# Patient Record
Sex: Male | Born: 1980 | Race: White | Hispanic: No | Marital: Single | Smoking: Current every day smoker
Health system: Southern US, Community
[De-identification: ages and names within clinical notes are randomized; demographics above are authoritative.]

## PROBLEM LIST (undated history)

## (undated) HISTORY — PX: TONSILLECTOMY: SUR1361

## (undated) HISTORY — PX: HERNIA REPAIR: SHX51

---
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
## 2001-08-03 ENCOUNTER — Emergency Department (HOSPITAL_COMMUNITY): Admission: EM | Admit: 2001-08-03 | Discharge: 2001-08-04 | Payer: Self-pay | Admitting: Emergency Medicine

## 2001-08-03 ENCOUNTER — Encounter: Payer: Self-pay | Admitting: Emergency Medicine

## 2001-08-04 ENCOUNTER — Encounter: Payer: Self-pay | Admitting: Emergency Medicine

## 2002-02-09 ENCOUNTER — Emergency Department (HOSPITAL_COMMUNITY): Admission: EM | Admit: 2002-02-09 | Discharge: 2002-02-09 | Payer: Self-pay | Admitting: *Deleted

## 2003-02-16 ENCOUNTER — Emergency Department (HOSPITAL_COMMUNITY): Admission: EM | Admit: 2003-02-16 | Discharge: 2003-02-16 | Payer: Self-pay | Admitting: Emergency Medicine

## 2003-03-19 ENCOUNTER — Ambulatory Visit (HOSPITAL_COMMUNITY): Admission: RE | Admit: 2003-03-19 | Discharge: 2003-03-19 | Payer: Self-pay | Admitting: Orthopaedic Surgery

## 2003-08-25 ENCOUNTER — Emergency Department (HOSPITAL_COMMUNITY): Admission: EM | Admit: 2003-08-25 | Discharge: 2003-08-25 | Payer: Self-pay | Admitting: Emergency Medicine

## 2003-08-31 ENCOUNTER — Observation Stay (HOSPITAL_COMMUNITY): Admission: RE | Admit: 2003-08-31 | Discharge: 2003-09-01 | Payer: Self-pay | Admitting: Orthopaedic Surgery

## 2005-01-01 ENCOUNTER — Emergency Department (HOSPITAL_COMMUNITY): Admission: EM | Admit: 2005-01-01 | Discharge: 2005-01-01 | Payer: Self-pay | Admitting: Emergency Medicine

## 2006-09-01 ENCOUNTER — Emergency Department (HOSPITAL_COMMUNITY): Admission: EM | Admit: 2006-09-01 | Discharge: 2006-09-01 | Payer: Self-pay | Admitting: Emergency Medicine

## 2007-12-24 ENCOUNTER — Emergency Department (HOSPITAL_COMMUNITY): Admission: EM | Admit: 2007-12-24 | Discharge: 2007-12-24 | Payer: Self-pay | Admitting: Emergency Medicine

## 2009-02-03 ENCOUNTER — Emergency Department (HOSPITAL_COMMUNITY): Admission: EM | Admit: 2009-02-03 | Discharge: 2009-02-03 | Payer: Self-pay | Admitting: Emergency Medicine

## 2009-07-20 ENCOUNTER — Emergency Department (HOSPITAL_COMMUNITY): Admission: EM | Admit: 2009-07-20 | Discharge: 2009-07-20 | Payer: Self-pay | Admitting: Emergency Medicine

## 2010-02-14 ENCOUNTER — Emergency Department (HOSPITAL_COMMUNITY)
Admission: EM | Admit: 2010-02-14 | Discharge: 2010-02-14 | Payer: Self-pay | Source: Home / Self Care | Admitting: Emergency Medicine

## 2010-06-16 NOTE — Op Note (Signed)
NAME:  Calvin, Jones                        ACCOUNT NO.:  [id number]   MEDICAL RECORD NO.:  [id number]                   PATIENT TYPE:  AMB   LOCATION:  DAY                                  FACILITY:  [facility]   PHYSICIAN:  J. Darreld Mclean, M.D.              DATE OF BIRTH:  1980-03-21   DATE OF PROCEDURE:  DATE OF DISCHARGE:                                 OPERATIVE REPORT   PREOPERATIVE DIAGNOSIS:  Left rotator cuff tear.   POSTOPERATIVE DIAGNOSIS:  Left rotator cuff tear.   PROCEDURE:  Left rotator cuff repair.   ANESTHESIA:  General.   DRAINS:  None.   SURGEON:  J. Darreld Mclean, M.D.   ASSISTANT:  Candace Cruise, P.A.-C.   ESTIMATED BLOOD LOSS:  Minimal.   INDICATIONS FOR PHYSICIAN'S ASSISTANT:  We are a small community hospital,  and there is no house staff.  The use of a physician's assistant is  indicated and necessary to be a first assistant to help me visualize and  help with the repair.   INDICATIONS FOR PROCEDURE:  The patient is a 30 year old male with pain and  tenderness in his left shoulder.  It has been bothering him since December  of last year.  He has not responded to conservative treatment.  MRI shows no  definite full-thickness tear but subtle changes showing a partial-thickness  tear.  The risks and imponderables of the procedure have been discussed  preoperatively.  He appears to understand and agreed to the procedure as  outlined.   DESCRIPTION OF PROCEDURE:  The patient was seen and identified in the  holding area.  The left shoulder was identified as the correct site.  I  marked it, he marked it.  The patient was taken to the operating room and  given general anesthesia.  Once again, we had a timeout before the procedure  began and verified that the left shoulder was the correct surgical site.  The patient was prepped and draped in the usual manner.   An outline for the incision was made, and with careful dissection, the  deltoid was exposed.   The so called weak spot in the deltoid was opened, and  the rotator cuff tear was exposed.  The coracoacromial ligament was  identified and removed sharply.  The undersurface of the acromion was  smooth.  There were no spurs.  There was not tightness here and no defect.  I felt that doing an acromioplasty was not indicated because he had plenty  of room for the motion of the shoulder, and I elected not to proceed in  doing an acromioplasty.  The rotator cuff had no obvious full-thickness  tear.  We made an incision.  The biceps was identified and was intact.  There was some roughening on the area of the rotator cuff.  It was probably  a partial-thickness tear, midportion, as described by the MRI.  We repaired  this using #2 Surgilon suture __________ direct repair.  Good repair was  obtained.  The patient was then put through a range of motion.  There was no  apparent bunching up of the rotator cuff.  There was no impingement.  The  deltoid was then repaired using 2-0 chromic, and the subcutaneous tissue was  approximated using 2-0 plain.  The skin was reapproximated using a running 3-  0 subcuticular nylon.  Steri-Strips were applied.  A sterile dressing was  applied.  A bulky dressing was applied.   The patient tolerated the procedure well and was taken to recovery in good  condition on a PCA pump with morphine.      ___________________________________________                                            Teola Bradley, M.D.   JWK/MEDQ  D:  08/31/2003  T:  08/31/2003  Job:  [id number]

## 2010-06-16 NOTE — H&P (Signed)
NAME:  Calvin Jones, Calvin Jones                          ACCOUNT NO.:  [id number]   MEDICAL RECORD NO.:  [id number]                  PATIENT TYPE:   LOCATION:                                       FACILITY:   PHYSICIAN:  J. Darreld Mclean, M.D.              DATE OF BIRTH:   DATE OF ADMISSION:  DATE OF DISCHARGE:                                HISTORY & PHYSICAL   CHIEF COMPLAINT:  Left shoulder pain.   HISTORY OF PRESENT ILLNESS:  The patient is a 30 year old male who injured  his left shoulder on or about January 02, 2003.  He was football with some  friends and ended up injuring his shoulder.  He went to the emergency room  on February 16, 2003 because of continued pain and tenderness.  X-rays were  taken and read as negative.  He was treated with Ultracet and ibuprofen.  The pain was still continuing.  The patient got progressively worse.  He is  right-hand dominant.  He was first seen in the office on February 14 of this  year.  At the time there was concern that he had a strain to his rotator  cuff and possible tear.  He underwent an MRI of his shoulder on February 18.  It showed a linear partial-thickness tear to the distal rotator cuff in the  mid portion.  It was felt that he had a subtle full-thickness communication  possible, but there was no definitive full-thickness tear present. There is  some mild changes of the [facility] joint, possible old healed fracture.  The  patient was given an injection to his shoulder on March 23, 2003 and  given a prescription for Vicodin.  His pain got better.  He went back to  work on April 06, 2003.  He continued to have pain and tenderness in his  shoulder.  Continued to have problems.  He had a Medrol Dosepak in April  which helped and he was placed on Celebrex.  Celebrex seemed to help as long  as he took it.  He still complained of pain and tenderness in his shoulder.  He had another injection in May which gave him temporarily relief.  He  continued  having problems.  Work really bothered him considerably.  He had  trouble sleeping, continued pain, continued tenderness.  Because of poor  response to conservative treatment, therapy, rest, medications, injections,  surgery has been recommended.  The patient desires to have surgery at this  time.   PAST MEDICAL HISTORY:  Negative.   ALLERGIES:  None.   MEDICATIONS:  He has been taking Celebrex and Vicodin ES.   SOCIAL HISTORY:  He smokes a pack of cigarettes per day.  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  He has no family physician.  The patient is  not married.  Works at a [employer].   PAST SURGICAL HISTORY:  He denies any previous  surgery.   FAMILY HISTORY:  Hypertension and diabetes runs in the family.   PHYSICAL EXAMINATION:  VITAL SIGNS:  The patient is 5 feet 8 inches, weighs  125 pounds.  Vital signs within normal limits.  HEENT:  Negative.  NECK:  Supple.  LUNGS:  Clear to percussion and auscultation.  HEART:  Regular rate and rhythm without murmur heard.  ABDOMEN:  Soft, nontender, no masses.  EXTREMITIES:  Left shoulder has a tattoo on the left deltoid area.  Range of  motion of the shoulder is full but painful and tender.  He has an  apprehension sign.  Stress of the rotator cuff and abduction is very  painful, very tender.  NEUROLOGIC: Intact.  All the extremities within normal limits.  Range of  motion of the neck is full.  CNS intact.  SKIN:  Intact.   IMPRESSION:  Rotator cuff tear, left.   PLAN:  Rotator cuff repair.  I discussed with the patient, type of  procedure, risks and imponderables.  He appears to understand and agrees to  the procedure as outlined.  __________ notes have been completed.  His labs  are pending.     ___________________________________________                                         Teola Bradley, M.D.   JWK/MEDQ  D:  08/30/2003  T:  08/30/2003  Job:  [id number]

## 2010-08-26 IMAGING — CT CT HEAD W/O CM
4 of 5 series · 13 of 47 positions shown, 14 images · non-contrast
Comparison: None

CT HEAD

CLINICAL DATA: Fell into pool striking top of head, pain

CT HEAD WITHOUT CONTRAST
CT CERVICAL SPINE WITHOUT CONTRAST
TECHNIQUE: Multidetector CT imaging of the head and cervical spine
was performed following the standard protocol without intravenous
contrast.  Multiplanar CT image reconstructions of the cervical
spine were also generated.

[Series 2: headseq 4.8 h37s · axial · 0.47mm/px · z∈[+365,+455]mm · 3 of 36 slices shown, 4 images]
[im 9/36  brain]
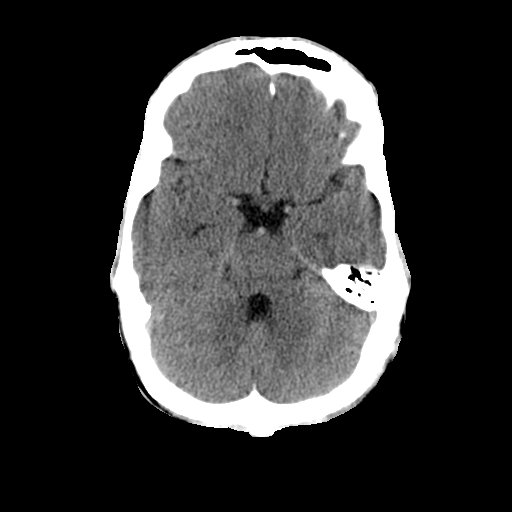
[im 9/36  bone]
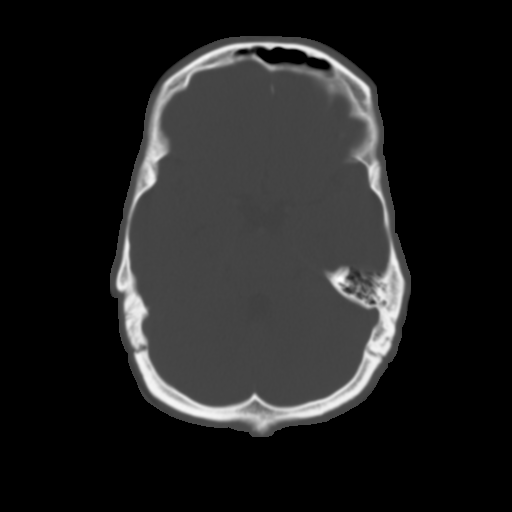
[im 18/36  brain]
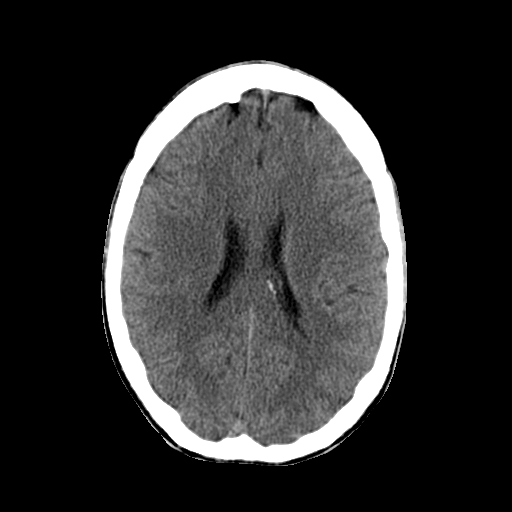
[im 27/36  brain]
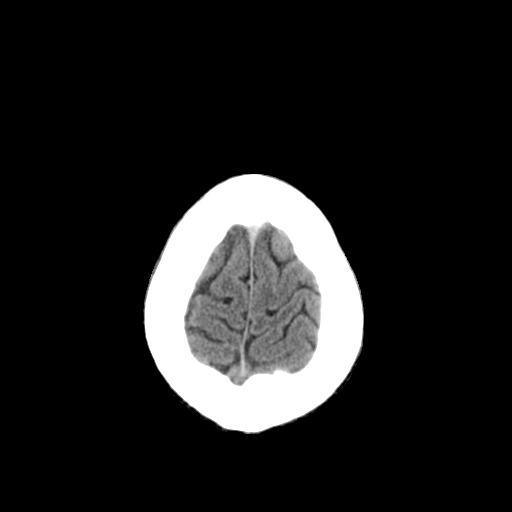

[Series 4: cervical st 2.0 b31s · axial · 0.24mm/px · z∈[+154,+218]mm · 4 of 99 slices shown]
[im 9/99  brain]
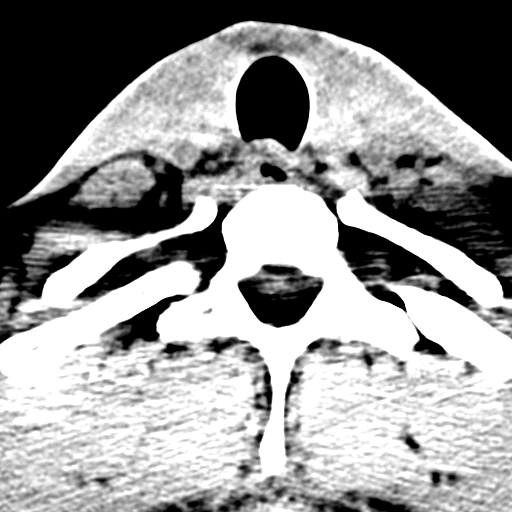
[im 25/99  brain]
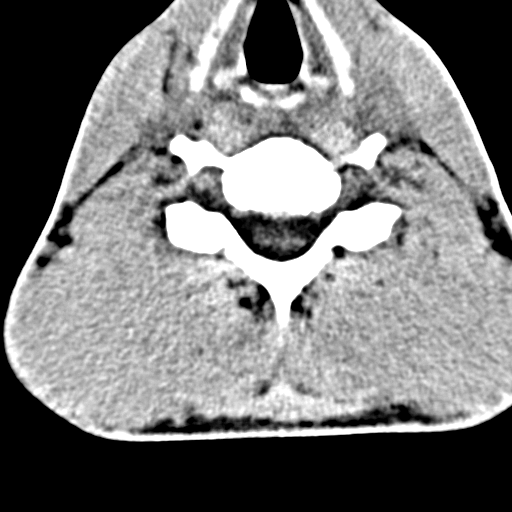
[im 33/99  brain]
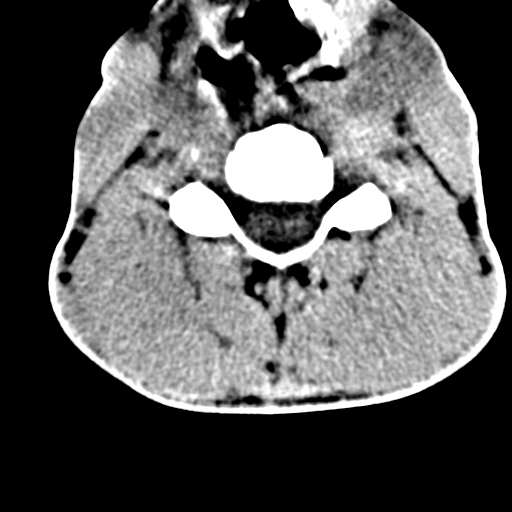
[im 41/99  brain]
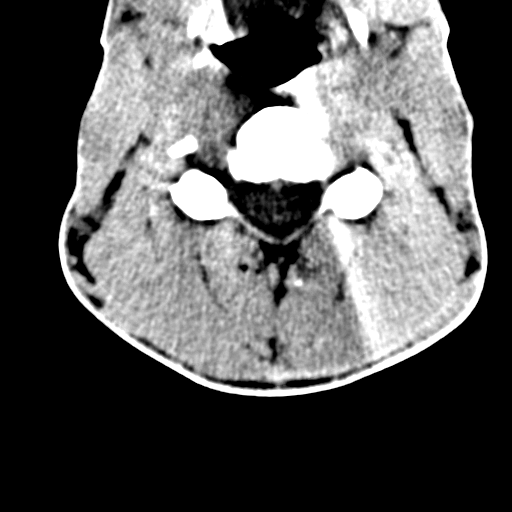

[Series 7: cervical coro (id) · coronal · 0.16mm/px · 3 of 43 slices shown]
[im 15/43  brain]
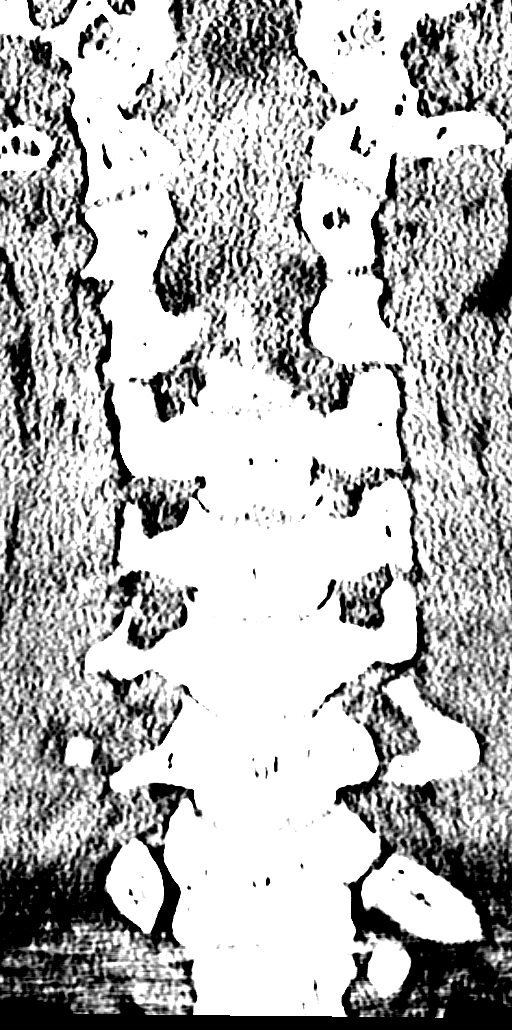
[im 19/43  brain]
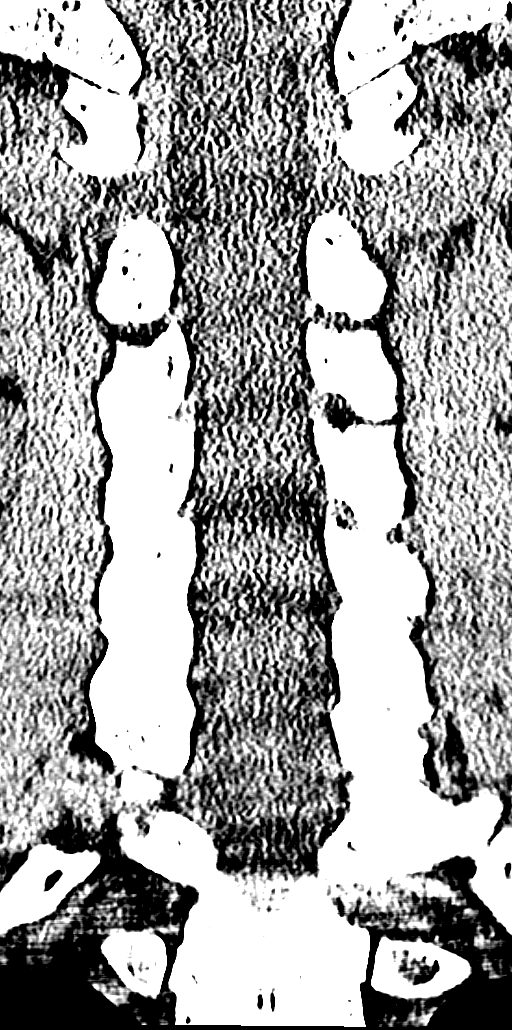
[im 24/43  brain]
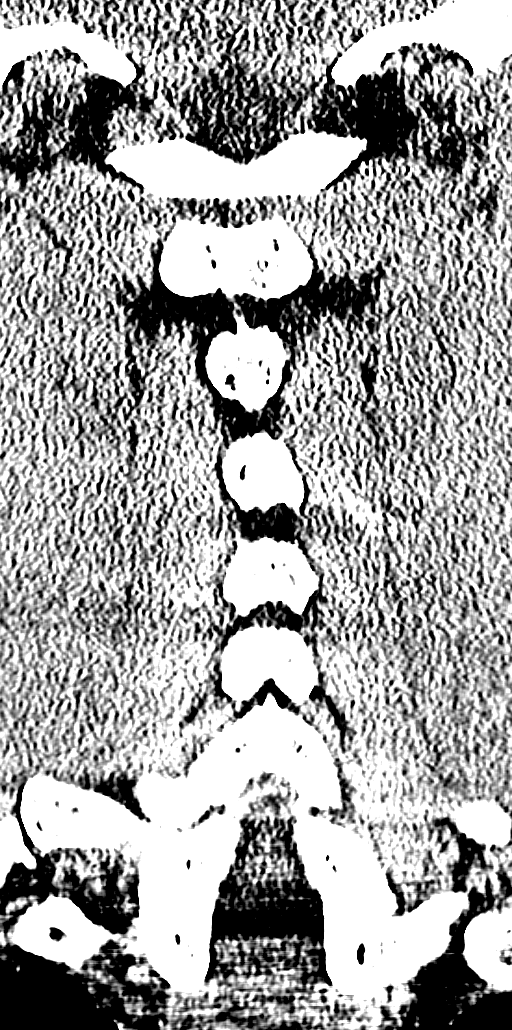

[Series 8: cervical sag (id) · sagittal · 0.19mm/px · 3 of 35 slices shown]
[im 12/35  brain]
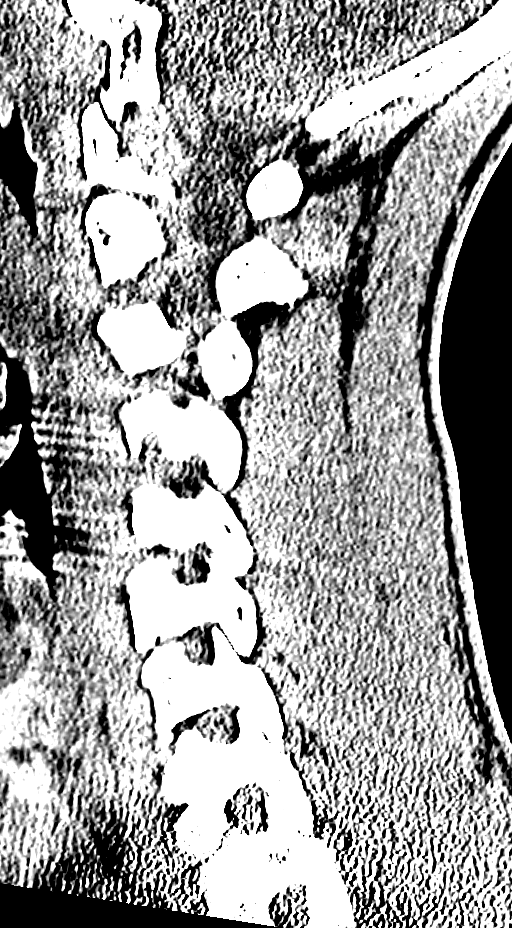
[im 18/35  brain]
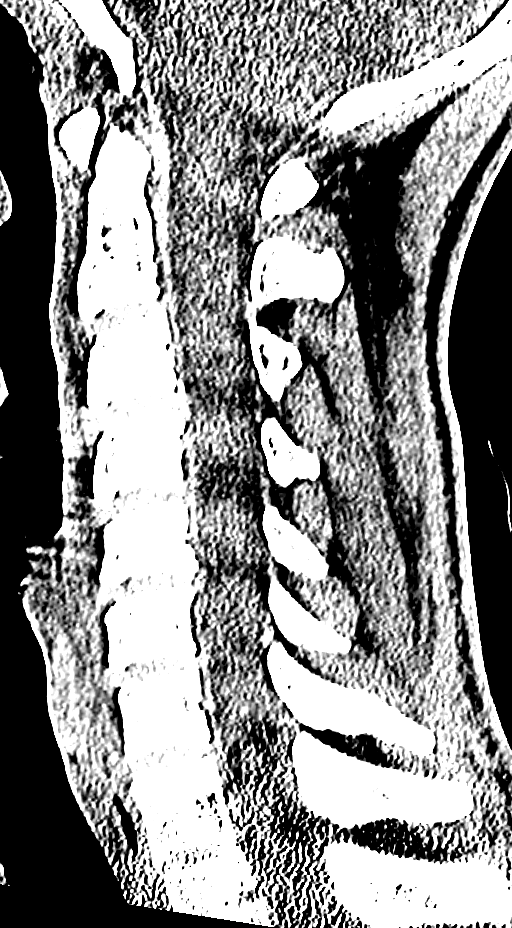
[im 23/35  brain]
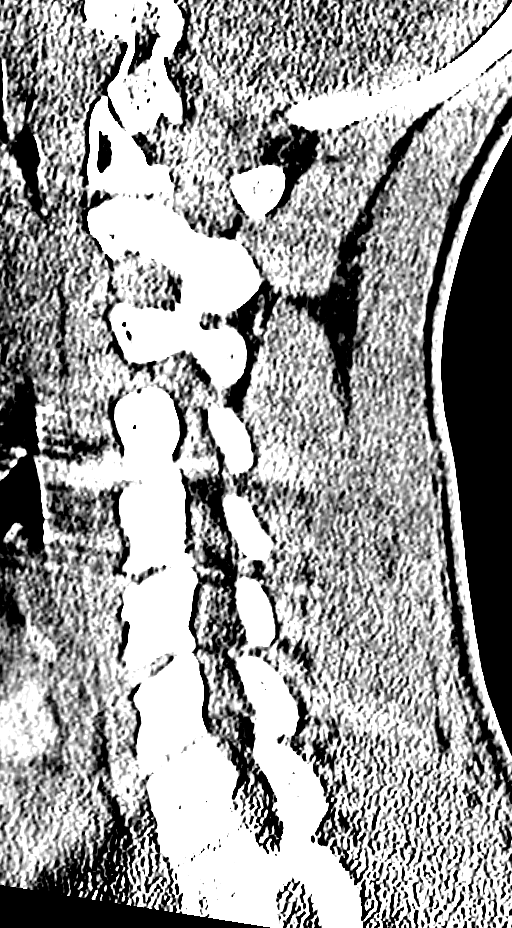

[13 of 47 positions shown; findings below may reference images not displayed]

FINDINGS: Right side of head marked with a BB.
Normal ventricular morphology.
No midline shift or mass effect.
Normal appearance of brain parenchyma.
No intracranial hemorrhage, mass lesion, or acute infarction.
Visualized paranasal sinuses and mastoid air cells clear.
Bones unremarkable.
IMPRESSION: No acute intracranial abnormalities.

CT CERVICAL SPINE
FINDINGS: Vertebral body and disc space heights maintained.
Facet alignments normal.
Vertebral soft tissues normal thickness.
No acute fracture, subluxation, or bone destruction.
Visualized skull base intact.
IMPRESSION: No acute cervical spine abnormalities.

## 2014-04-07 ENCOUNTER — Encounter (HOSPITAL_COMMUNITY): Payer: Self-pay | Admitting: Emergency Medicine

## 2014-04-07 ENCOUNTER — Emergency Department (HOSPITAL_COMMUNITY)
Admission: EM | Admit: 2014-04-07 | Discharge: 2014-04-07 | Disposition: A | Discharge status: Home / Self Care | Payer: Self-pay | Attending: Emergency Medicine | Admitting: Emergency Medicine

## 2014-04-07 DIAGNOSIS — W01198A Fall on same level from slipping, tripping and stumbling with subsequent striking against other object, initial encounter: Secondary | ICD-10-CM | POA: Insufficient documentation

## 2014-04-07 DIAGNOSIS — Y9289 Other specified places as the place of occurrence of the external cause: Secondary | ICD-10-CM | POA: Insufficient documentation

## 2014-04-07 DIAGNOSIS — Z720 Tobacco use: Secondary | ICD-10-CM | POA: Insufficient documentation

## 2014-04-07 DIAGNOSIS — Y998 Other external cause status: Secondary | ICD-10-CM | POA: Insufficient documentation

## 2014-04-07 DIAGNOSIS — S0181XA Laceration without foreign body of other part of head, initial encounter: Secondary | ICD-10-CM | POA: Insufficient documentation

## 2014-04-07 DIAGNOSIS — Y9389 Activity, other specified: Secondary | ICD-10-CM | POA: Insufficient documentation

## 2014-04-07 MED ORDER — HYDROCODONE-ACETAMINOPHEN 5-325 MG PO TABS
1.0000 | ORAL_TABLET | Freq: Once | ORAL | Status: AC
  Administered 2014-04-07: 1 via ORAL
  Filled 2014-04-07: qty 1

## 2014-04-07 MED ORDER — TETANUS-DIPHTH-ACELL PERTUSSIS 5-2.5-18.5 LF-MCG/0.5 IM SUSP
0.5000 mL | Freq: Once | INTRAMUSCULAR | Status: AC
  Administered 2014-04-07: 0.5 mL via INTRAMUSCULAR
  Filled 2014-04-07: qty 0.5

## 2014-04-07 MED ORDER — TETANUS-DIPHTH-ACELL PERTUSSIS 5-2.5-18.5 LF-MCG/0.5 IM SUSP
INTRAMUSCULAR | Status: AC
  Filled 2014-04-07: qty 0.5

## 2014-04-07 MED ORDER — LIDOCAINE-EPINEPHRINE-TETRACAINE (LET) SOLUTION
3.0000 mL | Freq: Once | NASAL | Status: AC
  Administered 2014-04-07: 3 mL via TOPICAL
  Filled 2014-04-07: qty 3

## 2014-04-07 MED ORDER — LIDOCAINE-EPINEPHRINE (PF) 1 %-1:200000 IJ SOLN
10.0000 mL | Freq: Once | INTRAMUSCULAR | Status: AC
  Administered 2014-04-07: 10 mL via INTRADERMAL
  Filled 2014-04-07: qty 10

## 2014-04-07 NOTE — ED Provider Notes (Signed)
CSN: [id number]     Arrival date & time 04/07/14  1534 History   First MD Initiated Contact with Patient 04/07/14 1616     Chief Complaint  Patient presents with  . Facial Laceration     (Consider location/radiation/quality/duration/timing/severity/associated sxs/prior Treatment) Patient is a 34 y.o. male presenting with skin laceration. The history is provided by the patient.  Laceration Location:  Face Facial laceration location:  [name] is a 34 y.o. male who presents to the ED with a laceration the chin. He reports that he was mowing the lawn and slipped and fell and cut his chin on a metal edge. Denies LOC.  He denies any other injuries. He is unsure of his last tetanus.   History reviewed. No pertinent past medical history. Past Surgical History  Procedure Laterality Date  . Hernia repair    . Tonsillectomy     No family history on file. History  Substance Use Topics  . Smoking status: Current Every Day Smoker -- 1.00 packs/day    Types: Cigarettes  . Smokeless tobacco: Not on file  . Alcohol Use: No    Review of Systems  Skin:       Laceration chin  all other systems negative    Allergies  Review of patient's allergies indicates no known allergies.  Home Medications   Prior to Admission medications   Not on File   BP 139/91 mmHg  Pulse 98  Temp(Src) 98.9 F (37.2 C) (Oral)  Resp 20  Ht 5\' 8"  (1.727 m)  Wt 120 lb (54.432 kg)  BMI 18.25 kg/m2  SpO2 99% Physical Exam  Constitutional: He is oriented to person, place, and time. He appears well-developed and well-nourished. No distress.  HENT:  Head:    2 cm laceration to the chin.  Eyes: Conjunctivae and EOM are normal.  Neck: Normal range of motion. Neck supple.  Cardiovascular: Normal rate.   Pulmonary/Chest: Effort normal.  Musculoskeletal: Normal range of motion.  Neurological: He is alert and oriented to person, place, and time. No cranial nerve deficit.  Skin:  Laceration  chin  Psychiatric: He has a normal mood and affect. His behavior is normal.  Nursing note and vitals reviewed.   ED Course  LACERATION REPAIR Date/Time: 04/07/2014 4:44 PM Performed by: Janne NapoleonNEESE, HOPE M Authorized by: Janne NapoleonNEESE, HOPE M Consent: Verbal consent obtained. Risks and benefits: risks, benefits and alternatives were discussed Consent given by: patient Patient understanding: patient states understanding of the procedure being performed Patient identity confirmed: verbally with patient Location: chin. Laceration length: 2 cm Foreign bodies: no foreign bodies Nerve involvement: none Vascular damage: no Anesthesia: local infiltration Local anesthetic: lidocaine 1% with epinephrine and LET (lido,epi,tetracaine) Anesthetic total: 2 ml Patient sedated: no Preparation: Patient was prepped and draped in the usual sterile fashion. Irrigation solution: saline Irrigation method: syringe Amount of cleaning: standard Debridement: none Degree of undermining: none Skin closure: 6-0 Prolene Number of sutures: 4 Suture technique: interrupted. Approximation: close Approximation difficulty: simple Dressing: 4x4 sterile gauze Patient tolerance: Patient tolerated the procedure well with no immediate complications    MDM  34 y.o. male with laceration to the chin. Stable for d/c without any immediate problems.  Discussed with the patient clinical findings and plan of care and all questioned fully answered. He will follow up in 5 days for suture removal or sooner if any problems arise.  Final diagnoses:  Laceration of chin without complication, initial encounter      [facility]  Damian Leavell, NP 04/07/14 1815  Rolland Porter, MD 04/12/14 [phone]

## 2014-04-07 NOTE — ED Notes (Signed)
NP at bedside applying sutures.

## 2014-04-07 NOTE — ED Notes (Signed)
Pt was mowing yard, slipped and fell and chin hit metal tray on lawn mower

## 2014-09-29 ENCOUNTER — Emergency Department (HOSPITAL_COMMUNITY)
Admission: EM | Admit: 2014-09-29 | Discharge: 2014-09-29 | Disposition: A | Discharge status: Home / Self Care | Payer: Self-pay | Attending: Emergency Medicine | Admitting: Emergency Medicine

## 2014-09-29 ENCOUNTER — Encounter (HOSPITAL_COMMUNITY): Payer: Self-pay

## 2014-09-29 DIAGNOSIS — Z720 Tobacco use: Secondary | ICD-10-CM | POA: Insufficient documentation

## 2014-09-29 DIAGNOSIS — M79641 Pain in right hand: Secondary | ICD-10-CM | POA: Insufficient documentation

## 2014-09-29 DIAGNOSIS — M79642 Pain in left hand: Secondary | ICD-10-CM | POA: Insufficient documentation

## 2014-09-29 MED ORDER — PREDNISONE 10 MG PO TABS: ORAL_TABLET | ORAL | Status: AC

## 2014-09-29 MED ORDER — ACETAMINOPHEN-CODEINE #3 300-30 MG PO TABS
2.0000 | ORAL_TABLET | Freq: Once | ORAL | Status: AC
  Administered 2014-09-29: 2 via ORAL
  Filled 2014-09-29: qty 2

## 2014-09-29 MED ORDER — IBUPROFEN 800 MG PO TABS
800.0000 mg | ORAL_TABLET | Freq: Once | ORAL | Status: AC
  Administered 2014-09-29: 800 mg via ORAL
  Filled 2014-09-29: qty 1

## 2014-09-29 MED ORDER — HYDROCODONE-ACETAMINOPHEN 5-325 MG PO TABS: 1.0000 | ORAL_TABLET | ORAL | Status: AC | PRN

## 2014-09-29 MED ORDER — PREDNISONE 50 MG PO TABS
60.0000 mg | ORAL_TABLET | Freq: Once | ORAL | Status: AC
  Administered 2014-09-29: 60 mg via ORAL
  Filled 2014-09-29 (×2): qty 1

## 2014-09-29 MED ORDER — MELOXICAM 15 MG PO TABS: 15.0000 mg | ORAL_TABLET | Freq: Every day | ORAL | Status: AC

## 2014-09-29 NOTE — ED Notes (Signed)
Pt mentioned he felt like he had a fever about 2 days ago, but it subsided.

## 2014-09-29 NOTE — ED Notes (Signed)
Lab at bedside

## 2014-09-29 NOTE — ED Notes (Signed)
hobson bryant at bedside.

## 2014-09-29 NOTE — ED Notes (Signed)
Pt reports woke up in the middle of the night with pain in both hands.  Pt also has rash to palms of hands, denies rash elsewhere.   Pt says hand itch a little bit but mostly is painful.

## 2014-09-29 NOTE — ED Provider Notes (Signed)
CSN: [id number]     Arrival date & time 09/29/14  1029 History   First MD Initiated Contact with Patient 09/29/14 1211     Chief Complaint  Patient presents with  . Hand Pain     (Consider location/radiation/quality/duration/timing/severity/associated sxs/prior Treatment) HPI Comments: Patient presents to the emergency department with a complaint of pain of both hands. The patient states that on yesterday he had some soreness of his hands, but last night he had pain in both hands that nearly brought him to tears. This morning he noted a rash in the palm of his hands. There was no rash in any other areas. The patient denies using gloves, he denies any new soaps. There is minimal itching reported, mostly pain. Patient is there had this before. Patient denies any new foods, medicines, or being in a new environments. He states that his work requires a lot of use of his hands.  The history is provided by the patient.    History reviewed. No pertinent past medical history. Past Surgical History  Procedure Laterality Date  . Hernia repair    . Tonsillectomy     No family history on file. Social History  Substance Use Topics  . Smoking status: Current Every Day Smoker -- 1.00 packs/day    Types: Cigarettes  . Smokeless tobacco: None  . Alcohol Use: No    Review of Systems  Musculoskeletal:       Hand pain  All other systems reviewed and are negative.     Allergies  Review of patient's allergies indicates no known allergies.  Home Medications   Prior to Admission medications   Medication Sig Start Date End Date Taking? Authorizing Provider  acetaminophen (TYLENOL) 500 MG tablet Take 1,000 mg by mouth every 6 (six) hours as needed.   Yes Historical Provider, MD   BP 121/83 mmHg  Pulse 83  Temp(Src) 98.2 F (36.8 C) (Oral)  Resp 16  Ht 5\' 8"  (1.727 m)  Wt 130 lb (58.968 kg)  BMI 19.77 kg/m2  SpO2 100% Physical Exam  Constitutional: He is oriented to person, place, and  time. He appears well-developed and well-nourished.  Non-toxic appearance.  HENT:  Head: Normocephalic.  Right Ear: Tympanic membrane and external ear normal.  Left Ear: Tympanic membrane and external ear normal.  Eyes: EOM and lids are normal. Pupils are equal, round, and reactive to light.  Neck: Normal range of motion. Neck supple. Carotid bruit is not present.  Cardiovascular: Normal rate, regular rhythm, normal heart sounds, intact distal pulses and normal pulses.   Pulmonary/Chest: Breath sounds normal. No respiratory distress.  Abdominal: Soft. Bowel sounds are normal. There is no tenderness. There is no guarding.  Musculoskeletal: Normal range of motion.  There is pain to flexion and extension of the fingers of both hands. There is pain to palpation in the center of the palms. There is soreness of the wrist forearm area. No swelling appreciated. No increased redness. The hands, wrist, and forearm are not hot.  Lymphadenopathy:       Head (right side): No submandibular adenopathy present.       Head (left side): No submandibular adenopathy present.    He has no cervical adenopathy.  Neurological: He is alert and oriented to person, place, and time. He has normal strength. No cranial nerve deficit or sensory deficit.  Skin: Skin is warm and dry.  Macular rash of the palm of the hands. No rash anywhere else.  Psychiatric: He has a normal  mood and affect. His speech is normal.  Nursing note and vitals reviewed.   ED Course  Procedures (including critical care time) Labs Review Labs Reviewed  RPR  HIV ANTIBODY (ROUTINE TESTING)    Imaging Review No results found. I have personally reviewed and evaluated these images and lab results as part of my medical decision-making.   EKG Interpretation None      MDM  No gross neurologic deficits appreciated of the upper extremities. There is a rash of the palmar surface of the right and left hand. There is pain with flexion and  extension of the fingers of both hands. 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 Question if the patient may also have a viral illness as he thinks he may have had a fever a few days ago, and has some mild nasal congestion. The patient will be checked for secondary syphilis as well. The patient is in agreement with this discharge plan. A prescription for Norco, mobile, and prednisone were given to the patient.    Final diagnoses:  None    **I have reviewed nursing notes, vital signs, and all appropriate lab and imaging results for this patient.Ivery Quale, PA-C 10/01/14 0825  Geoffery Lyons, MD 10/04/14 [phone]

## 2014-09-29 NOTE — Discharge Instructions (Signed)
Your vital signs are within normal limits. The lab test will take 24-48 hours to return with results. Someone from the flow manager's office will call you with results if any abnormalities. Please rest your hands and wrist. Please use mobile daily with food, prednisone taper as prescribed, and Norco every 4 hours if needed for pain. Norco may cause drowsiness, please use with caution.

## 2014-09-30 LAB — HIV ANTIBODY (ROUTINE TESTING W REFLEX): HIV Screen 4th Generation wRfx: NONREACTIVE

## 2014-09-30 LAB — RPR: RPR Ser Ql: NONREACTIVE

## 2018-07-29 ENCOUNTER — Other Ambulatory Visit: Payer: Self-pay

## 2018-07-29 DIAGNOSIS — Z20822 Contact with and (suspected) exposure to covid-19: Secondary | ICD-10-CM

## 2018-07-29 NOTE — Progress Notes (Signed)
[id number]  

## 2018-08-04 LAB — NOVEL CORONAVIRUS, NAA: SARS-CoV-2, NAA: NOT DETECTED

## 2018-08-26 ENCOUNTER — Other Ambulatory Visit: Payer: Self-pay

## 2018-08-26 ENCOUNTER — Other Ambulatory Visit: Payer: Managed Care, Other (non HMO)

## 2018-08-26 DIAGNOSIS — Z20822 Contact with and (suspected) exposure to covid-19: Secondary | ICD-10-CM

## 2018-08-28 LAB — NOVEL CORONAVIRUS, NAA: SARS-CoV-2, NAA: NOT DETECTED
